# Patient Record
Sex: Male | Born: 1955 | Race: White | Hispanic: No | Marital: Single | Smoking: Never smoker
Health system: Southern US, Community
[De-identification: ages and names within clinical notes are randomized; demographics above are authoritative.]

## PROBLEM LIST (undated history)

## (undated) DIAGNOSIS — E119 Type 2 diabetes mellitus without complications: Secondary | ICD-10-CM

## (undated) DIAGNOSIS — E785 Hyperlipidemia, unspecified: Secondary | ICD-10-CM

---

## 2004-11-20 ENCOUNTER — Ambulatory Visit: Payer: Self-pay | Admitting: Internal Medicine

## 2009-02-21 ENCOUNTER — Ambulatory Visit: Payer: Self-pay | Admitting: Gastroenterology

## 2014-03-02 ENCOUNTER — Ambulatory Visit: Payer: Self-pay | Admitting: Internal Medicine

## 2014-04-19 DIAGNOSIS — M5116 Intervertebral disc disorders with radiculopathy, lumbar region: Secondary | ICD-10-CM | POA: Insufficient documentation

## 2016-06-27 DIAGNOSIS — E119 Type 2 diabetes mellitus without complications: Secondary | ICD-10-CM | POA: Insufficient documentation

## 2017-07-13 DIAGNOSIS — E039 Hypothyroidism, unspecified: Secondary | ICD-10-CM | POA: Insufficient documentation

## 2018-01-20 HISTORY — PX: OTHER SURGICAL HISTORY: SHX169

## 2018-08-05 ENCOUNTER — Other Ambulatory Visit: Payer: Self-pay

## 2018-08-05 ENCOUNTER — Ambulatory Visit: Payer: Self-pay | Admitting: Surgery

## 2018-08-05 ENCOUNTER — Other Ambulatory Visit
Admission: RE | Admit: 2018-08-05 | Discharge: 2018-08-05 | Disposition: A | Discharge status: Home / Self Care | Payer: PRIVATE HEALTH INSURANCE | Source: Ambulatory Visit | Attending: Surgery | Admitting: Surgery

## 2018-08-05 DIAGNOSIS — Z1159 Encounter for screening for other viral diseases: Secondary | ICD-10-CM | POA: Diagnosis not present

## 2018-08-05 NOTE — H&P (View-Only) (Signed)
Subjective:   CC: Enlarged lymph node [R59.9]  HPI:  Tyler Cox is a 63 y.o. male who was referred by Yevonne Pax, MD for evaluation of above. First noted several weeks ago.  Non-tender, not changing in size.  Trial of abx did not make any difference.  Complains of chronic throat irritation with episodic sensation of food getting stuck in throat, but no emesis.  Mostly non-productive cough.  Not associated with specific activity, time of day, or temperature.  Has not tried OTC remedies   Past Medical History:  has a past medical history of Diabetes mellitus type 2, uncomplicated (CMS-HCC) and Other and unspecified hyperlipidemia.  Past Surgical History: none reported  Family History: family history includes Atrial fibrillation (Abnormal heart rhythm sometimes requiring treatment with blood thinners) in his mother; Colon cancer in an other family member; Coronary Artery Disease (Blocked arteries around heart) in an other family member; Diabetes in his mother; High blood pressure (Hypertension) in his father; Prostate cancer in his father.  Social History:  reports that he has never smoked. He has never used smokeless tobacco. He reports that he does not drink alcohol or use drugs.  Current Medications: has a current medication list which includes the following prescription(s): lisinopril and metformin.  Allergies:  No Known Allergies  ROS:  A 15 point review of systems was performed and pertinent positives and negatives noted in HPI   Objective:   BP 153/81   Pulse 96   Ht 167.6 cm (5\' 6" )   Wt 72.1 kg (159 lb)   BMI 25.66 kg/m   Constitutional :  alert, appears stated age, cooperative and no distress  Lymphatics/Throat:  no asymmetry, masses, or scars  Respiratory:  clear to auscultation bilaterally  Cardiovascular:  regular rate and rhythm  Gastrointestinal: soft, non-tender; bowel sounds normal; no masses,  no organomegaly.    Musculoskeletal:  Steady gait and movement  Skin: Cool and moist.  Palpable right supraclavicular node, towards base of neck, anterior to trapezius.  Size roughly 2cm x 2cm.  Smooth, mobile, no overlying skin change.  No other skin lesions, adenopathy noted on contralateral aspect, distal right arm, head and scalp area.  Psychiatric: Normal affect, non-agitated, not confused       LABS:  n/a   RADS: External CXR negative for specific pathology  Assessment:      Enlarged lymph node [R59.9], RIGHT SUPRACLAVICULAR  Plan:   1. Enlarged lymph node [R59.9] Discussed surgical excision.  Alternatives include continued observation.  Benefits include possible symptom relief, pathologic evaluation. Discussed the risk of surgery including recurrence, injury to surrounding structures, chronic pain, post-op infxn, poor cosmesis, poor/delayed wound healing, and possible re-operation to address said risks. The risks of general anesthetic, if used, includes MI, CVA, sudden death or even reaction to anesthetic medications also discussed.  Typical post-op recovery time of 3-5 days with possible activity restrictions were also discussed.  The patient verbalized understanding and all questions were answered to the patient's satisfaction.  2. Patient has elected to proceed with surgical excision for pathology evaluation. Procedure will be scheduled.  Written consent was obtained.

## 2018-08-05 NOTE — H&P (Signed)
Subjective:   CC: Enlarged lymph node [R59.9]  HPI:  Tyler Cox is a 62 y.o. male who was referred by Mark Frederic Miller, MD for evaluation of above. First noted several weeks ago.  Non-tender, not changing in size.  Trial of abx did not make any difference.  Complains of chronic throat irritation with episodic sensation of food getting stuck in throat, but no emesis.  Mostly non-productive cough.  Not associated with specific activity, time of day, or temperature.  Has not tried OTC remedies   Past Medical History:  has a past medical history of Diabetes mellitus type 2, uncomplicated (CMS-HCC) and Other and unspecified hyperlipidemia.  Past Surgical History: none reported  Family History: family history includes Atrial fibrillation (Abnormal heart rhythm sometimes requiring treatment with blood thinners) in his mother; Colon cancer in an other family member; Coronary Artery Disease (Blocked arteries around heart) in an other family member; Diabetes in his mother; High blood pressure (Hypertension) in his father; Prostate cancer in his father.  Social History:  reports that he has never smoked. He has never used smokeless tobacco. He reports that he does not drink alcohol or use drugs.  Current Medications: has a current medication list which includes the following prescription(s): lisinopril and metformin.  Allergies:  No Known Allergies  ROS:  A 15 point review of systems was performed and pertinent positives and negatives noted in HPI   Objective:   BP 153/81   Pulse 96   Ht 167.6 cm (5' 6")   Wt 72.1 kg (159 lb)   BMI 25.66 kg/m   Constitutional :  alert, appears stated age, cooperative and no distress  Lymphatics/Throat:  no asymmetry, masses, or scars  Respiratory:  clear to auscultation bilaterally  Cardiovascular:  regular rate and rhythm  Gastrointestinal: soft, non-tender; bowel sounds normal; no masses,  no organomegaly.    Musculoskeletal:  Steady gait and movement  Skin: Cool and moist.  Palpable right supraclavicular node, towards base of neck, anterior to trapezius.  Size roughly 2cm x 2cm.  Smooth, mobile, no overlying skin change.  No other skin lesions, adenopathy noted on contralateral aspect, distal right arm, head and scalp area.  Psychiatric: Normal affect, non-agitated, not confused       LABS:  n/a   RADS: External CXR negative for specific pathology  Assessment:      Enlarged lymph node [R59.9], RIGHT SUPRACLAVICULAR  Plan:   1. Enlarged lymph node [R59.9] Discussed surgical excision.  Alternatives include continued observation.  Benefits include possible symptom relief, pathologic evaluation. Discussed the risk of surgery including recurrence, injury to surrounding structures, chronic pain, post-op infxn, poor cosmesis, poor/delayed wound healing, and possible re-operation to address said risks. The risks of general anesthetic, if used, includes MI, CVA, sudden death or even reaction to anesthetic medications also discussed.  Typical post-op recovery time of 3-5 days with possible activity restrictions were also discussed.  The patient verbalized understanding and all questions were answered to the patient's satisfaction.  2. Patient has elected to proceed with surgical excision for pathology evaluation. Procedure will be scheduled.  Written consent was obtained.  

## 2018-08-06 LAB — SARS CORONAVIRUS 2 (TAT 6-24 HRS): SARS Coronavirus 2: NEGATIVE

## 2018-08-06 NOTE — Patient Instructions (Signed)
Your procedure is scheduled [id number] 7/20 Report to Day Surgery. At 9:30  Remember: Instructions that are not followed completely may result in serious medical risk,  up to and including death, or upon the discretion of your surgeon and anesthesiologist your  surgery may need to be rescheduled.     _X__ 1. Do not eat food after midnight the night before your procedure.                 No gum chewing or hard candies. You may drink clear liquids up to 2 hours                 before you are scheduled to arrive for your surgery- DO not drink clear                 liquids within 2 hours of the start of your surgery.                 Clear Liquids include:  water, apple juice without pulp, clear carbohydrate                 drink such as Clearfast of Gatorade, Black Coffee or Tea (Do not add                 anything to coffee or tea).  __X__2.  On the morning of surgery brush your teeth with toothpaste and water, you                may rinse your mouth with mouthwash if you wish.  Do not swallow any toothpaste of mouthwash.     _X__ 3.  No Alcohol for 24 hours before or after surgery.   ___ 4.  Do Not Smoke or use e-cigarettes For 24 Hours Prior to Your Surgery.                 Do not use any chewable tobacco products for at least 6 hours prior to                 surgery.  ____  5.  Bring all medications with you on the day of surgery if instructed.   __x__  6.  Notify your doctor if there is any change in your medical condition      (cold, fever, infections).     Do not wear jewelry, make-up, hairpins, clips or nail polish. Do not wear lotions, powders, or perfumes. You may wear deodorant. Do not shave 48 hours prior to surgery. Men may shave face and neck. Do not bring valuables to the hospital.    [facility] is not responsible for any belongings or valuables.  Contacts, dentures or bridgework may not be worn into surgery. Leave your suitcase in the car. After  surgery it may be brought to your room. For patients admitted to the hospital, discharge time is determined by your treatment team.   Patients discharged the day of surgery will not be allowed to drive home.   Please read over the following fact sheets that you were given:     __x__ Take these medicines the morning of surgery with A SIP OF WATER:    1. none  2.   3.   4.  5.  6.  ____ Fleet Enema (as directed)   _x___ Shower the night before and the morning of the surgery.  ___ Use inhalers on the day of surgery  _x___ Stop metformin 2 days prior to surgery  last dose today    ____ Take 1/2 of usual insulin dose the night before surgery. No insulin the morning          of surgery.   _x__ Stopped aspirin already    __x__ Stopped Anti-inflammatories already   ____ Stop supplements until after surgery.    ____ Bring C-Pap to the hospital.

## 2018-08-06 NOTE — Pre-Procedure Instructions (Signed)
Phone call with patient to discuss instructions on medication for surgery on Monday 7/20.  He is aware of his arrival time.  All questions answered.  Will stop Metformin today and has not taken ibuprofen or ASA in 1 week.

## 2018-08-08 MED ORDER — CEFAZOLIN SODIUM-DEXTROSE 2-4 GM/100ML-% IV SOLN
2.0000 g | INTRAVENOUS | Status: AC
  Administered 2018-08-09: 2 g via INTRAVENOUS

## 2018-08-09 ENCOUNTER — Other Ambulatory Visit: Payer: Self-pay

## 2018-08-09 ENCOUNTER — Ambulatory Visit
Admission: RE | Admit: 2018-08-09 | Discharge: 2018-08-09 | Disposition: A | Discharge status: Home / Self Care | Payer: PRIVATE HEALTH INSURANCE | Attending: Surgery | Admitting: Surgery

## 2018-08-09 ENCOUNTER — Ambulatory Visit: Payer: PRIVATE HEALTH INSURANCE | Admitting: Anesthesiology

## 2018-08-09 ENCOUNTER — Encounter
Admission: RE | Disposition: A | Discharge status: Home / Self Care | Payer: Self-pay | Source: Home / Self Care | Attending: Surgery

## 2018-08-09 ENCOUNTER — Encounter: Payer: Self-pay | Admitting: *Deleted

## 2018-08-09 DIAGNOSIS — Z7984 Long term (current) use of oral hypoglycemic drugs: Secondary | ICD-10-CM | POA: Diagnosis not present

## 2018-08-09 DIAGNOSIS — E119 Type 2 diabetes mellitus without complications: Secondary | ICD-10-CM | POA: Insufficient documentation

## 2018-08-09 DIAGNOSIS — R599 Enlarged lymph nodes, unspecified: Secondary | ICD-10-CM

## 2018-08-09 DIAGNOSIS — R590 Localized enlarged lymph nodes: Secondary | ICD-10-CM | POA: Insufficient documentation

## 2018-08-09 DIAGNOSIS — Z79899 Other long term (current) drug therapy: Secondary | ICD-10-CM | POA: Insufficient documentation

## 2018-08-09 HISTORY — PX: LYMPH NODE BIOPSY: SHX201

## 2018-08-09 HISTORY — DX: Type 2 diabetes mellitus without complications: E11.9

## 2018-08-09 HISTORY — DX: Hyperlipidemia, unspecified: E78.5

## 2018-08-09 LAB — GLUCOSE, CAPILLARY
Glucose-Capillary: 139 mg/dL — ABNORMAL HIGH (ref 70–99)
Glucose-Capillary: 161 mg/dL — ABNORMAL HIGH (ref 70–99)

## 2018-08-09 SURGERY — LYMPH NODE BIOPSY
Anesthesia: General | Site: Neck | Laterality: Right

## 2018-08-09 MED ORDER — DEXAMETHASONE SODIUM PHOSPHATE 10 MG/ML IJ SOLN
INTRAMUSCULAR | Status: DC | PRN
  Administered 2018-08-09: 5 mg via INTRAVENOUS

## 2018-08-09 MED ORDER — ACETAMINOPHEN 325 MG PO TABS: 650.0000 mg | ORAL_TABLET | Freq: Three times a day (TID) | ORAL | 0 refills | Status: AC | PRN

## 2018-08-09 MED ORDER — DEXAMETHASONE SODIUM PHOSPHATE 10 MG/ML IJ SOLN
INTRAMUSCULAR | Status: AC
  Filled 2018-08-09: qty 1

## 2018-08-09 MED ORDER — PROPOFOL 10 MG/ML IV BOLUS
INTRAVENOUS | Status: AC
  Filled 2018-08-09: qty 20

## 2018-08-09 MED ORDER — SUCCINYLCHOLINE CHLORIDE 20 MG/ML IJ SOLN
INTRAMUSCULAR | Status: DC | PRN
  Administered 2018-08-09: 100 mg via INTRAVENOUS

## 2018-08-09 MED ORDER — FENTANYL CITRATE (PF) 100 MCG/2ML IJ SOLN
INTRAMUSCULAR | Status: DC | PRN
  Administered 2018-08-09: 100 ug via INTRAVENOUS

## 2018-08-09 MED ORDER — DOCUSATE SODIUM 100 MG PO CAPS: 100.0000 mg | ORAL_CAPSULE | Freq: Two times a day (BID) | ORAL | 0 refills | Status: AC | PRN

## 2018-08-09 MED ORDER — FAMOTIDINE 20 MG PO TABS
20.0000 mg | ORAL_TABLET | Freq: Once | ORAL | Status: AC
  Administered 2018-08-09: 20 mg via ORAL

## 2018-08-09 MED ORDER — ONDANSETRON HCL 4 MG/2ML IJ SOLN
INTRAMUSCULAR | Status: AC
  Filled 2018-08-09: qty 2

## 2018-08-09 MED ORDER — HYDROCODONE-ACETAMINOPHEN 5-325 MG PO TABS: 1.0000 | ORAL_TABLET | Freq: Four times a day (QID) | ORAL | 0 refills | Status: AC | PRN

## 2018-08-09 MED ORDER — SODIUM CHLORIDE 0.9 % IV SOLN
INTRAVENOUS | Status: DC
  Administered 2018-08-09: 11:00:00 via INTRAVENOUS

## 2018-08-09 MED ORDER — FENTANYL CITRATE (PF) 100 MCG/2ML IJ SOLN: 25.0000 ug | INTRAMUSCULAR | Status: DC | PRN

## 2018-08-09 MED ORDER — BUPIVACAINE-EPINEPHRINE (PF) 0.5% -1:200000 IJ SOLN
INTRAMUSCULAR | Status: AC
  Filled 2018-08-09: qty 30

## 2018-08-09 MED ORDER — CHLORHEXIDINE GLUCONATE CLOTH 2 % EX PADS: 6.0000 | MEDICATED_PAD | Freq: Once | CUTANEOUS | Status: DC

## 2018-08-09 MED ORDER — BUPIVACAINE-EPINEPHRINE 0.5% -1:200000 IJ SOLN
INTRAMUSCULAR | Status: DC | PRN
  Administered 2018-08-09: 1.5 mL

## 2018-08-09 MED ORDER — MIDAZOLAM HCL 2 MG/2ML IJ SOLN
INTRAMUSCULAR | Status: DC | PRN
  Administered 2018-08-09: 2 mg via INTRAVENOUS

## 2018-08-09 MED ORDER — OXYCODONE HCL 5 MG PO TABS: 5.0000 mg | ORAL_TABLET | Freq: Once | ORAL | Status: DC | PRN

## 2018-08-09 MED ORDER — IBUPROFEN 800 MG PO TABS: 800.0000 mg | ORAL_TABLET | Freq: Three times a day (TID) | ORAL | 0 refills | Status: AC | PRN

## 2018-08-09 MED ORDER — OXYCODONE HCL 5 MG/5ML PO SOLN: 5.0000 mg | Freq: Once | ORAL | Status: DC | PRN

## 2018-08-09 MED ORDER — ONDANSETRON HCL 4 MG/2ML IJ SOLN
INTRAMUSCULAR | Status: DC | PRN
  Administered 2018-08-09: 4 mg via INTRAVENOUS

## 2018-08-09 MED ORDER — FAMOTIDINE 20 MG PO TABS
ORAL_TABLET | ORAL | Status: AC
  Filled 2018-08-09: qty 1

## 2018-08-09 MED ORDER — ACETAMINOPHEN 10 MG/ML IV SOLN
INTRAVENOUS | Status: DC | PRN
  Administered 2018-08-09: 1000 mg via INTRAVENOUS

## 2018-08-09 MED ORDER — LIDOCAINE HCL 1 % IJ SOLN
INTRAMUSCULAR | Status: DC | PRN
  Administered 2018-08-09: 1.5 mL

## 2018-08-09 MED ORDER — CEFAZOLIN SODIUM-DEXTROSE 2-4 GM/100ML-% IV SOLN
INTRAVENOUS | Status: AC
  Filled 2018-08-09: qty 100

## 2018-08-09 MED ORDER — LIDOCAINE HCL (CARDIAC) PF 100 MG/5ML IV SOSY
PREFILLED_SYRINGE | INTRAVENOUS | Status: DC | PRN
  Administered 2018-08-09: 60 mg via INTRAVENOUS

## 2018-08-09 MED ORDER — ACETAMINOPHEN 10 MG/ML IV SOLN
INTRAVENOUS | Status: AC
  Filled 2018-08-09: qty 100

## 2018-08-09 MED ORDER — PROPOFOL 10 MG/ML IV BOLUS
INTRAVENOUS | Status: DC | PRN
  Administered 2018-08-09: 130 mg via INTRAVENOUS

## 2018-08-09 MED ORDER — MIDAZOLAM HCL 2 MG/2ML IJ SOLN
INTRAMUSCULAR | Status: AC
  Filled 2018-08-09: qty 2

## 2018-08-09 MED ORDER — FENTANYL CITRATE (PF) 100 MCG/2ML IJ SOLN
INTRAMUSCULAR | Status: AC
  Filled 2018-08-09: qty 2

## 2018-08-09 MED ORDER — LIDOCAINE HCL (PF) 2 % IJ SOLN
INTRAMUSCULAR | Status: AC
  Filled 2018-08-09: qty 10

## 2018-08-09 MED ORDER — LIDOCAINE HCL (PF) 1 % IJ SOLN
INTRAMUSCULAR | Status: AC
  Filled 2018-08-09: qty 30

## 2018-08-09 MED ORDER — SUCCINYLCHOLINE CHLORIDE 20 MG/ML IJ SOLN
INTRAMUSCULAR | Status: AC
  Filled 2018-08-09: qty 1

## 2018-08-09 SURGICAL SUPPLY — 31 items
BLADE SURG 15 STRL LF DISP TIS (BLADE) ×1 IMPLANT
BLADE SURG 15 STRL SS (BLADE) ×2
CHLORAPREP W/TINT 26 (MISCELLANEOUS) ×3 IMPLANT
COVER WAND RF STERILE (DRAPES) ×3 IMPLANT
DERMABOND ADVANCED (GAUZE/BANDAGES/DRESSINGS) ×2
DERMABOND ADVANCED .7 DNX12 (GAUZE/BANDAGES/DRESSINGS) ×1 IMPLANT
DRAPE 3/4 80X56 (DRAPES) ×3 IMPLANT
DRAPE LAPAROTOMY 100X77 ABD (DRAPES) ×3 IMPLANT
ELECT CAUTERY BLADE 6.4 (BLADE) ×3 IMPLANT
ELECT REM PT RETURN 9FT ADLT (ELECTROSURGICAL) ×3
ELECTRODE REM PT RTRN 9FT ADLT (ELECTROSURGICAL) ×1 IMPLANT
GLOVE BIOGEL PI IND STRL 7.0 (GLOVE) ×1 IMPLANT
GLOVE BIOGEL PI INDICATOR 7.0 (GLOVE) ×2
GLOVE SURG SYN 6.5 ES PF (GLOVE) ×3 IMPLANT
GLOVE SURG SYN 6.5 PF PI (GLOVE) ×1 IMPLANT
GOWN STRL REUS W/ TWL LRG LVL3 (GOWN DISPOSABLE) ×2 IMPLANT
GOWN STRL REUS W/TWL LRG LVL3 (GOWN DISPOSABLE) ×4
KIT TURNOVER KIT A (KITS) ×3 IMPLANT
LABEL OR SOLS (LABEL) ×3 IMPLANT
NEEDLE HYPO 22GX1.5 SAFETY (NEEDLE) ×3 IMPLANT
NS IRRIG 1000ML POUR BTL (IV SOLUTION) ×3 IMPLANT
PACK BASIN MINOR ARMC (MISCELLANEOUS) ×3 IMPLANT
SUT ETHILON 3-0 FS-10 30 BLK (SUTURE)
SUT MNCRL 4-0 (SUTURE) ×2
SUT MNCRL 4-0 27XMFL (SUTURE) ×1
SUT VIC AB 3-0 SH 27 (SUTURE) ×2
SUT VIC AB 3-0 SH 27X BRD (SUTURE) ×1 IMPLANT
SUTURE EHLN 3-0 FS-10 30 BLK (SUTURE) IMPLANT
SUTURE MNCRL 4-0 27XMF (SUTURE) ×1 IMPLANT
SYR 30ML LL (SYRINGE) ×3 IMPLANT
TOWEL OR 17X26 4PK STRL BLUE (TOWEL DISPOSABLE) ×3 IMPLANT

## 2018-08-09 NOTE — Discharge Instructions (Addendum)
Biopsy, Care After °This sheet gives you information about how to care for yourself after your procedure. Your health care provider may also give you more specific instructions. If you have problems or questions, contact your health care provider. °What can I expect after the procedure? °After the procedure, it is common to have: °· Soreness. °· Bruising. °· Itching. °Follow these instructions at home: °Biopsy site care °Follow instructions from your health care provider about how to take care of your biopsy site. Make sure you: °· Wash your hands with soap and water before and after you change your bandage (dressing). If soap and water are not available, use hand sanitizer. °· Leave stitches (sutures), skin glue, or adhesive strips in place. These skin closures may need to stay in place for 2 weeks or longer. If adhesive strip edges start to loosen and curl up, you may trim the loose edges. Do not remove adhesive strips completely unless your health care provider tells you to do that. °· If the biopsy area bleeds or bruises, apply gentle pressure for 10 minutes. °· OK TO SHOWER IN 24HRS °Check your biopsy site every day for signs of infection. Check for: °· Redness, swelling, or pain. °· Fluid or blood. °· Warmth. °· Pus or a bad smell. ° °General instructions °· Rest and then return to your normal activities as told by your health care provider. °•  tylenol and advil as needed for discomfort.  Please alternate between the two every four hours as needed for pain.   °•  Use narcotics, if prescribed, only when tylenol and motrin is not enough to control pain. °•  325-650mg every 8hrs to max of 3000mg/24hrs (including the 325mg in every norco dose) for the tylenol.   °•  Advil up to 800mg per dose every 8hrs as needed for pain.   °· Keep all follow-up visits as told by your health care provider. This is important. °Contact a health care provider if: °· You have redness, swelling, or pain around your biopsy  site. °· You have fluid or blood coming from your biopsy site. °· Your biopsy site feels warm to the touch. °· You have pus or a bad smell coming from your biopsy site. °· You have a fever. °· Your sutures, skin glue, or adhesive strips loosen or come off sooner than expected. °Get help right away if: °· You have bleeding that does not stop with pressure or a dressing. °Summary °· After the procedure, it is common to have some soreness, bruising, and itching at the site. °· Follow instructions from your health care provider about how to take care of your biopsy site. °· Check your biopsy site every day for signs of infection. °· Contact a health care provider if you have redness, swelling, or pain around your biopsy site, or your biopsy site feels warm to the touch. °· Keep all follow-up visits as told by your health care provider. This is important. °This information is not intended to replace advice given to you by your health care provider. Make sure you discuss any questions you have with your health care provider. °Document Released: 02/02/2015 Document Revised: 07/06/2017 Document Reviewed: 07/06/2017 °Elsevier Interactive Patient Education © 2019 [employer] ° °AMBULATORY SURGERY  °DISCHARGE INSTRUCTIONS ° ° °1) The drugs that you were given will stay in your system until tomorrow so for the next 24 hours you should not: ° °A) Drive an automobile °B) Make any legal decisions °C) Drink any alcoholic beverage ° ° °  2) You may resume regular meals tomorrow.  Today it is better to start with liquids and gradually work up to solid foods. ° °You may eat anything you prefer, but it is better to start with liquids, then soup and crackers, and gradually work up to solid foods. ° ° °3) Please notify your doctor immediately if you have any unusual bleeding, trouble breathing, redness and pain at the surgery site, drainage, fever, or pain not relieved by medication. ° ° ° °4) Additional  Instructions: ° ° ° ° ° ° ° °Please contact your physician with any problems or Same Day Surgery at [phone], Monday through Friday 6 am to 4 pm, or [place] [facility] number at [phone]. ° ° °

## 2018-08-09 NOTE — Transfer of Care (Signed)
Immediate Anesthesia Transfer of Care Note  Patient: Tyler Cox  Procedure(s) Performed: LYMPH NODE EXCISION OF SUPRACLAVICULAR, RIGHT (Right Neck)  Patient Location: PACU  Anesthesia Type:General  Level of Consciousness: drowsy and patient cooperative  Airway & Oxygen Therapy: Patient Spontanous Breathing and Patient connected to face mask oxygen  Post-op Assessment: Report given to RN and Post -op Vital signs reviewed and stable  Post vital signs: Reviewed and stable  Last Vitals:  Vitals Value Taken Time  BP 139/81 08/09/18 1151  Temp 36.4 C 08/09/18 1150  Pulse 66 08/09/18 1153  Resp 12 08/09/18 1153  SpO2 100 % 08/09/18 1153  Vitals shown include unvalidated device data.  Last Pain:  Vitals:   08/09/18 1150  TempSrc:   PainSc: 0-No pain         Complications: No apparent anesthesia complications

## 2018-08-09 NOTE — Anesthesia Procedure Notes (Signed)
Procedure Name: Intubation Date/Time: 08/09/2018 11:06 AM Performed by: Jonna Clark, CRNA Pre-anesthesia Checklist: Patient identified, Patient being monitored, Timeout performed, Emergency Drugs available and Suction available Patient Re-evaluated:Patient Re-evaluated prior to induction Oxygen Delivery Method: Circle system utilized Preoxygenation: Pre-oxygenation with 100% oxygen Induction Type: IV induction and Cricoid Pressure applied Ventilation: Mask ventilation without difficulty Laryngoscope Size: Mac and 3 Grade View: Grade III Tube type: Oral Tube size: 7.5 mm Number of attempts: 1 Placement Confirmation: ETT inserted through vocal cords under direct vision,  positive ETCO2 and breath sounds checked- equal and bilateral Secured at: 21 cm Tube secured with: Tape Dental Injury: Teeth and Oropharynx as per pre-operative assessment  Difficulty Due To: Difficult Airway- due to anterior larynx and Difficulty was unanticipated Future Recommendations: Recommend- induction with short-acting agent, and alternative techniques readily available

## 2018-08-09 NOTE — Anesthesia Preprocedure Evaluation (Signed)
Anesthesia Evaluation  Patient identified by MRN, date of birth, ID band Patient awake    Reviewed: Allergy & Precautions, H&P , NPO status , Patient's Chart, lab work & pertinent test results  History of Anesthesia Complications Negative for: history of anesthetic complications  Airway Mallampati: III  TM Distance: <3 FB Neck ROM: limited    Dental  (+) Chipped, Poor Dentition   Pulmonary neg shortness of breath, Recent URI , Residual Cough,           Cardiovascular Exercise Tolerance: Good      Neuro/Psych negative neurological ROS  negative psych ROS   GI/Hepatic negative GI ROS, Neg liver ROS, neg GERD  ,  Endo/Other  diabetes, Type 2  Renal/GU      Musculoskeletal   Abdominal   Peds  Hematology negative hematology ROS (+)   Anesthesia Other Findings Past Medical History: No date: Diabetes mellitus without complication (HCC) No date: Hyperlipidemia  Past Surgical History: No date: NO PAST SURGERIES  BMI    Body Mass Index: 25.66 kg/m      Reproductive/Obstetrics negative OB ROS                             Anesthesia Physical Anesthesia Plan  ASA: III  Anesthesia Plan: General ETT   Post-op Pain Management:    Induction: Intravenous  PONV Risk Score and Plan: Ondansetron, Dexamethasone, Midazolam and Treatment may vary due to age or medical condition  Airway Management Planned: Oral ETT  Additional Equipment:   Intra-op Plan:   Post-operative Plan: Extubation in OR  Informed Consent: I have reviewed the patients History and Physical, chart, labs and discussed the procedure including the risks, benefits and alternatives for the proposed anesthesia with the patient or authorized representative who has indicated his/her understanding and acceptance.     Dental Advisory Given  Plan Discussed with: Anesthesiologist, CRNA and Surgeon  Anesthesia Plan Comments:  (Patient consented for risks of anesthesia including but not limited to:  - adverse reactions to medications - damage to teeth, lips or other oral mucosa - sore throat or hoarseness - Damage to heart, brain, lungs or loss of life  Patient voiced understanding.)        Anesthesia Quick Evaluation

## 2018-08-09 NOTE — Op Note (Signed)
Pre-Op Dx: right cervical lymph node enlargement Post-Op Dx: Large Right deep cervical lymph node x1 Anesthesia: GETA EBL: minimal Complications:  none apparent Specimen: Right deep cervical lymph node Procedure: excisional biopsy of Right deep cervical lymph node  Surgeon: Lysle Pearl  Indication for procedure: Palpable right cervical lymph node.  Please see H&P for further detail   Description of Procedure:  Consent obtained, time out performed.  Patient placed in supine position.  GETA induced by anesthesia.  Preoperative antibiotics given.  Area sterilized and draped in usual position.  Timeout performed local infused to area previously marked.  4 cm incision made through dermis with 15blade and SCM retracted to the posterior aspect to reveal a 2 cm x 2 cm enlarged lymph node superficial to the internal jugular vein..  This was removed from surrounding tissue completely using electrocautery and blunt dissection, passed off field pending pathology.  Wound hemostasis noted, no injury to surrounding structures.wound then irrigated, then closed in two layer fashion with 4-0 vicryl in interrupted fashion for deep dermal layer, then running 4-0 monocryl in subcuticular fashion for epidermal layer.  Wound then dressed with dermabond.  Pt tolerated procedure well, and transferred to PACU in stable condition. Sponge and instrument count correct at end of procedure.

## 2018-08-09 NOTE — Anesthesia Postprocedure Evaluation (Signed)
Anesthesia Post Note  Patient: Tyler Cox  Procedure(s) Performed: LYMPH NODE EXCISION OF SUPRACLAVICULAR, RIGHT (Right Neck)  Patient location during evaluation: PACU Anesthesia Type: General Level of consciousness: awake and alert Pain management: pain level controlled Vital Signs Assessment: post-procedure vital signs reviewed and stable Respiratory status: spontaneous breathing, nonlabored ventilation, respiratory function stable and patient connected to nasal cannula oxygen Cardiovascular status: blood pressure returned to baseline and stable Postop Assessment: no apparent nausea or vomiting Anesthetic complications: no     Last Vitals:  Vitals:   08/09/18 1205 08/09/18 1220  BP: (!) 154/80 136/87  Pulse: 71 77  Resp: 11 14  Temp:  36.7 C  SpO2: 100% 100%    Last Pain:  Vitals:   08/09/18 1220  TempSrc:   PainSc: 0-No pain                 Precious Haws Piscitello

## 2018-08-09 NOTE — Interval H&P Note (Signed)
History and Physical Interval Note:  08/09/2018 10:58 AM  Tyler Cox  has presented today for surgery, with the diagnosis of R59.9 ENLARGED LYMPH NODE.  The various methods of treatment have been discussed with the patient and family. After consideration of risks, benefits and other options for treatment, the patient has consented to  Procedure(s): LYMPH NODE EXCISION OF SUPRACLAVICULAR, RIGHT (Right) as a surgical intervention.  The patient's history has been reviewed, patient examined, no change in status, stable for surgery.  I have reviewed the patient's chart and labs.  Questions were answered to the patient's satisfaction.     [name]

## 2018-08-09 NOTE — Anesthesia Post-op Follow-up Note (Signed)
Anesthesia QCDR form completed.        

## 2018-08-12 ENCOUNTER — Encounter: Payer: Self-pay | Admitting: Cytopathology

## 2018-08-12 LAB — SURGICAL PATHOLOGY

## 2018-08-18 DIAGNOSIS — K219 Gastro-esophageal reflux disease without esophagitis: Secondary | ICD-10-CM | POA: Insufficient documentation

## 2018-09-21 ENCOUNTER — Other Ambulatory Visit: Payer: Self-pay | Admitting: Internal Medicine

## 2018-09-21 ENCOUNTER — Other Ambulatory Visit (HOSPITAL_COMMUNITY): Payer: Self-pay | Admitting: Internal Medicine

## 2018-09-21 DIAGNOSIS — R197 Diarrhea, unspecified: Secondary | ICD-10-CM

## 2018-09-21 DIAGNOSIS — R310 Gross hematuria: Secondary | ICD-10-CM

## 2018-09-24 ENCOUNTER — Other Ambulatory Visit
Admission: RE | Admit: 2018-09-24 | Discharge: 2018-09-24 | Disposition: A | Discharge status: Home / Self Care | Payer: PRIVATE HEALTH INSURANCE | Attending: Internal Medicine | Admitting: Internal Medicine

## 2018-09-24 ENCOUNTER — Ambulatory Visit
Admission: RE | Admit: 2018-09-24 | Discharge: 2018-09-24 | Disposition: A | Discharge status: Home / Self Care | Payer: PRIVATE HEALTH INSURANCE | Source: Ambulatory Visit | Attending: Internal Medicine | Admitting: Internal Medicine

## 2018-09-24 ENCOUNTER — Encounter (INDEPENDENT_AMBULATORY_CARE_PROVIDER_SITE_OTHER): Payer: Self-pay

## 2018-09-24 ENCOUNTER — Other Ambulatory Visit: Payer: Self-pay

## 2018-09-24 DIAGNOSIS — R310 Gross hematuria: Secondary | ICD-10-CM

## 2018-09-24 DIAGNOSIS — R197 Diarrhea, unspecified: Secondary | ICD-10-CM

## 2018-09-28 ENCOUNTER — Other Ambulatory Visit: Payer: Self-pay | Admitting: Internal Medicine

## 2018-09-28 DIAGNOSIS — R197 Diarrhea, unspecified: Secondary | ICD-10-CM

## 2018-09-28 DIAGNOSIS — R1084 Generalized abdominal pain: Secondary | ICD-10-CM

## 2018-09-28 DIAGNOSIS — R319 Hematuria, unspecified: Secondary | ICD-10-CM

## 2018-09-29 ENCOUNTER — Ambulatory Visit: Admission: RE | Admit: 2018-09-29 | Payer: PRIVATE HEALTH INSURANCE | Source: Ambulatory Visit

## 2018-10-01 ENCOUNTER — Other Ambulatory Visit: Payer: Self-pay

## 2018-10-01 ENCOUNTER — Ambulatory Visit
Admission: RE | Admit: 2018-10-01 | Discharge: 2018-10-01 | Disposition: A | Discharge status: Home / Self Care | Payer: PRIVATE HEALTH INSURANCE | Source: Ambulatory Visit | Attending: Internal Medicine | Admitting: Internal Medicine

## 2018-10-01 DIAGNOSIS — R197 Diarrhea, unspecified: Secondary | ICD-10-CM | POA: Diagnosis present

## 2018-10-01 DIAGNOSIS — R319 Hematuria, unspecified: Secondary | ICD-10-CM | POA: Diagnosis present

## 2018-10-01 DIAGNOSIS — R1084 Generalized abdominal pain: Secondary | ICD-10-CM | POA: Insufficient documentation

## 2018-11-22 ENCOUNTER — Encounter (INDEPENDENT_AMBULATORY_CARE_PROVIDER_SITE_OTHER): Payer: Self-pay | Admitting: Vascular Surgery

## 2018-11-23 ENCOUNTER — Encounter (INDEPENDENT_AMBULATORY_CARE_PROVIDER_SITE_OTHER): Payer: Self-pay | Admitting: Vascular Surgery

## 2018-11-23 ENCOUNTER — Ambulatory Visit (INDEPENDENT_AMBULATORY_CARE_PROVIDER_SITE_OTHER): Payer: PRIVATE HEALTH INSURANCE | Admitting: Vascular Surgery

## 2018-11-23 ENCOUNTER — Encounter (INDEPENDENT_AMBULATORY_CARE_PROVIDER_SITE_OTHER): Payer: Self-pay

## 2018-11-23 ENCOUNTER — Other Ambulatory Visit: Payer: Self-pay

## 2018-11-23 VITALS — BP 177/96 | HR 76 | Resp 16 | Wt 153.2 lb

## 2018-11-23 DIAGNOSIS — R1013 Epigastric pain: Secondary | ICD-10-CM | POA: Diagnosis not present

## 2018-11-23 DIAGNOSIS — G8929 Other chronic pain: Secondary | ICD-10-CM | POA: Insufficient documentation

## 2018-11-23 DIAGNOSIS — E119 Type 2 diabetes mellitus without complications: Secondary | ICD-10-CM

## 2018-11-23 NOTE — Progress Notes (Signed)
Patient ID: Tyler Cox, male   DOB: 02/11/1955, 63 y.o.   MRN: [id number]  Chief Complaint  Patient presents with  . New Patient (Initial Visit)    ref Sabra Heck presistent abdominal pain,mesenteric ischemia    HPI Tyler Cox is a 63 y.o. male.  I am asked to see the patient by Dr. Sabra Heck for evaluation of chronic mesenteric ischemia.  The patient has had unexplained abdominal pain now for many months.  He has had fairly extensive work-up that has been unrevealing thus far including abdominal ultrasound and CT scan.  He had what sounds like a fairly adverse reaction to 3 different proton pump inhibitors for his reflux disease.  His pain is mostly mid abdominal.  Sometimes large meals or certain foods seem to exacerbate the pain but it is a little difficult to discern.  He has not had a significant amount of weight loss although he did say he lost some weight early on in the process.  He has some chronic back pain that has been present and as bothersome as his abdominal pain as well.  No real food fear.  No fevers or chills.     Past Medical History:  Diagnosis Date  . Diabetes mellitus without complication ([place])   . Hyperlipidemia     Past Surgical History:  Procedure Laterality Date  . LYMPH NODE BIOPSY Right 08/09/2018   Procedure: LYMPH NODE EXCISION OF SUPRACLAVICULAR, RIGHT;  Surgeon: Benjamine Sprague, DO;  Location: [facility] ORS;  Service: General;  Laterality: Right;  . NO PAST SURGERIES       Family History  Problem Relation Age of Onset  . Arrhythmia Mother   . Hypertension Mother   . Heart disease Father   . Heart attack Father   . Hypertension Father   No bleeding or clotting disorders   Social History   Tobacco Use  . Smoking status: Never Smoker  . Smokeless tobacco: Never Used  Substance Use Topics  . Alcohol use: Never    Frequency: Never  . Drug use: Never    Allergies  Allergen Reactions  . Omeprazole Diarrhea    Current Outpatient  Medications  Medication Sig Dispense Refill  . ibuprofen (ADVIL) 800 MG tablet Take 1 tablet (800 mg total) by mouth every 8 (eight) hours as needed for mild pain or moderate pain. 30 tablet 0  . lisinopril (ZESTRIL) 10 MG tablet Take 10 mg by mouth daily.    . metFORMIN (GLUCOPHAGE) 500 MG tablet Take 500 mg by mouth daily.    . sucralfate (CARAFATE) 1 g tablet Take by mouth.     No current facility-administered medications for this visit.       REVIEW OF SYSTEMS (Negative unless checked)  Constitutional: [] Weight loss  [] Fever  [] Chills Cardiac: [] Chest pain   [] Chest pressure   [] Palpitations   [] Shortness of breath when laying flat   [] Shortness of breath at rest   [] Shortness of breath with exertion. Vascular:  [] Pain in legs with walking   [] Pain in legs at rest   [] Pain in legs when laying flat   [] Claudication   [] Pain in feet when walking  [] Pain in feet at rest  [] Pain in feet when laying flat   [] History of DVT   [] Phlebitis   [] Swelling in legs   [] Varicose veins   [] Non-healing ulcers Pulmonary:   [] Uses home oxygen   [] Productive cough   [] Hemoptysis   [] Wheeze  [] COPD   [] Asthma Neurologic:  []   Dizziness  [] Blackouts   [] Seizures   [] History of stroke   [] History of TIA  [] Aphasia   [] Temporary blindness   [] Dysphagia   [] Weakness or numbness in arms   [] Weakness or numbness in legs Musculoskeletal:  [] Arthritis   [] Joint swelling   [] Joint pain   [x] Low back pain Hematologic:  [] Easy bruising  [] Easy bleeding   [] Hypercoagulable state   [] Anemic  [] Hepatitis Gastrointestinal:  [] Blood in stool   [] Vomiting blood  [x] Gastroesophageal reflux/heartburn   [x] Abdominal pain Genitourinary:  [] Chronic kidney disease   [] Difficult urination  [] Frequent urination  [] Burning with urination   [] Hematuria Skin:  [] Rashes   [] Ulcers   [] Wounds Psychological:  [] History of anxiety   []  History of major depression.    Physical Exam BP (!) 177/96 (BP Location: Right Arm)   Pulse 76    Resp 16   Wt 153 lb 3.2 oz (69.5 kg)   BMI 24.73 kg/m  Gen:  WD/WN, NAD Head: [place]/AT, No temporalis wasting. Ear/Nose/Throat: Hearing grossly intact, nares w/o erythema or drainage, oropharynx w/o Erythema/Exudate Eyes: Conjunctiva clear, sclera non-icteric  Neck: trachea midline.  No JVD.  Pulmonary:  Good air movement, respirations not labored, no use of accessory muscles  Cardiac: RRR, no JVD Vascular:  Vessel Right Left  Radial Palpable Palpable                                   Gastrointestinal:. No masses, surgical incisions, or scars.  Mild tenderness to palpation in the epigastric region.  No rebound or guarding. Musculoskeletal: M/S 5/5 throughout.  Extremities without ischemic changes.  No deformity or atrophy. No significant LE edema. Neurologic: Sensation grossly intact in extremities.  Symmetrical.  Speech is fluent. Motor exam as listed above. Psychiatric: Judgment intact, Mood & affect appropriate for pt's clinical situation. Dermatologic: No rashes or ulcers noted.  No cellulitis or open wounds.    Radiology No results found.  Labs No results found for this or any previous visit (from the past 2160 hour(s)).  Assessment/Plan:  Controlled type 2 diabetes mellitus without complication, without long-term current use of insulin (HCC) blood glucose control important in reducing the progression of atherosclerotic disease. Also, involved in wound healing. On appropriate medications.   Abdominal pain, chronic, epigastric The patient has unexplained abdominal pain that is not clear in its etiology.  He has had a CT and an ultrasound which have not shown obvious pathology.  Chronic mesenteric ischemia is often a scenario where many other etiologies have been evaluated without finding a source.  His symptoms are not classic for chronic visceral ischemia, but I do think it is reasonable to perform a mesenteric duplex for further evaluation.  I discussed the  pathophysiology and the natural history of chronic mesenteric ischemia.  I have described why it causes symptoms and why can be difficult to diagnose.  The duplex to be done in the near future at his convenience and we will see the patient back following this to discuss the results and determine further treatment options      11/23/2018, 10:49 AM   This note was created with Dragon medical transcription system.  Any errors from dictation are unintentional.

## 2018-11-23 NOTE — Assessment & Plan Note (Signed)
The patient has unexplained abdominal pain that is not clear in its etiology.  He has had a CT and an ultrasound which have not shown obvious pathology.  Chronic mesenteric ischemia is often a scenario where many other etiologies have been evaluated without finding a source.  His symptoms are not classic for chronic visceral ischemia, but I do think it is reasonable to perform a mesenteric duplex for further evaluation.  I discussed the pathophysiology and the natural history of chronic mesenteric ischemia.  I have described why it causes symptoms and why can be difficult to diagnose.  The duplex to be done in the near future at his convenience and we will see the patient back following this to discuss the results and determine further treatment options

## 2018-11-23 NOTE — Patient Instructions (Signed)
Chronic Mesenteric Ischemia  Chronic mesenteric ischemia is poor blood flow (circulation) in the vessels that supply blood to the stomach, intestines, and liver (mesenteric organs). When the blood supply is severely restricted, these organs cannot work properly. This condition is also called mesenteric angina, or intestinal angina. This condition is a long-term (chronic) condition. It happens when an artery or vein that provides blood to the mesenteric organs gradually becomes blocked or narrows over time, restricting the blood supply to these organs. What are the causes? This condition is commonly caused by fatty deposits that build up in an artery (plaque), which can narrow the artery and restrict blood flow. Other causes include:  Weakened areas in blood vessel walls (aneurysms).  Conditions that cause twisting or inflammation of blood vessels, such as fibromuscular dysplasia or arteritis.  A disorder in which blood clots form in the veins (venous thrombosis).  Scarring and thickening (fibrosis) of blood vessels caused by radiation therapy.  A tear in the aorta, the body's main artery (aortic dissection).  Blood vessel problems after illegal drug use, such as use of cocaine.  Tumors in the nervous system (neurofibromatosis).  Certain autoimmune diseases, such as lupus. What increases the risk? The following factors may make you more likely to develop this condition:  Being male.  Being over age 50, especially if you have a history of heart problems.  Smoking.  Having congestive heart failure.  Having an irregular heartbeat (arrhythmia).  Having a history of heart attack or stroke.  Having diabetes.  Having high cholesterol.  Having high blood pressure (hypertension).  Being overweight or obese.  Having kidney disease (renal disease) that requires dialysis. What are the signs or symptoms? Symptoms of this condition include:  Pain or cramps in the abdomen that  develop 15-60 minutes after a meal. This pain may last for 1-3 hours. Some people may develop a fear of eating because of this symptom.  Weight loss.  Diarrhea.  Bloody stool.  Nausea.  Vomiting.  Bloating.  Abdominal pain after stress or with exercise. How is this diagnosed? This condition is diagnosed based on:  Your medical history.  A physical exam.  Tests, such as: ? Ultrasound. ? CT scan. ? Blood tests. ? Urine tests. ? An imaging test that involves injecting a dye into your arteries to show blood flow through blood vessels (angiogram). This can help to show if there are any blockages in the vessels that lead to the intestines. ? Passing a small probe through the mouth and into the stomach to measure the output of carbon dioxide (gastric tonometry). This can help to indicate whether there is decreased blood flow to the stomach and intestines. How is this treated? This condition may be treated with:  Dietary changes such as eating smaller, low-fat, meals more frequently.  Lifestyle changes to treat underlying conditions that contribute to the disease, such as high cholesterol and high blood pressure.  Medicines to reduce blood clotting and increase blood flow.  Surgery to remove the blockage, repair arteries or veins, and restore blood flow. This may involve: ? Angioplasty. This is surgery to widen the affected artery, reduce the blockage, and sometimes insert a small, mesh tube (stent). ? Bypass surgery. This may be done to go around (bypass) the blockage and reconnect healthy arteries or veins. ? Placing a stent in the affected area. This may be done to help keep blocked arteries open. Follow these instructions at home: Eating and drinking   Eat a heart-healthy diet. This   includes fresh fruits and vegetables, whole grains, and lean proteins like chicken, fish, and beans.  Avoid foods that contain a lot of: ? Salt (sodium). ? Sugar. ? Saturated fat (such as  red meat). ? Trans fat (such as in fried foods).  Stay hydrated. Drink enough fluid to keep your urine pale yellow. Lifestyle  Stay active and get regular exercise as told by your health care provider. Aim for 150 minutes of moderate activity or 75 minutes of vigorous activity a week. Ask your health care provider what activities and forms of exercise are safe for you.  Maintain a healthy weight.  Work with your health care provider to manage your cholesterol.  Manage any other health problems you have, such as high blood pressure, diabetes, or heart rhythm problems.  Do not use any products that contain nicotine or tobacco, such as cigarettes, e-cigarettes, and chewing tobacco. If you need help quitting, ask your health care provider. General instructions  Take over-the-counter and prescription medicines only as told by your health care provider.  Keep all follow-up visits as told by your health care provider. This is important.  You may need to take actions to prevent or treat constipation, such as: ? Drink enough fluid to keep your urine pale yellow. ? Take over-the-counter or prescription medicines. ? Eat foods that are high in fiber, such as beans, whole grains, and fresh fruits and vegetables. ? Limit foods that are high in fat and processed sugars, such as fried or sweet foods. Contact a health care provider if:  Your symptoms do not improve or they return after treatment.  You have a fever.  You are constipated. Get help right away if you:  Have severe abdominal pain.  Have severe chest pain.  Have shortness of breath.  Feel weak or dizzy.  Have fast or irregular heartbeats (palpitations).  Have numbness or weakness in your face, arm, or leg.  Are confused.  Have trouble speaking or people have trouble understanding what you are saying.  Have trouble urinating.  Have blood in your stool.  Have severe nausea, vomiting, or persistent diarrhea. These  symptoms may represent a serious problem that is an emergency. Do not wait to see if the symptoms will go away. Get medical help right away. Call your local emergency services (911 in the [place] Do not drive yourself to the hospital. Summary  Mesenteric ischemia is poor circulation in the vessels that supply blood to the the stomach, intestines, and liver (mesenteric organs).  This condition happens when an artery or vein that provides blood to the mesenteric organs gradually becomes blocked or narrow, restricting the blood supply to the organs.  This condition is commonly caused by fatty deposits that build up in an artery (plaque), which can narrow the artery and restrict blood flow.  You are more likely to develop this condition if you are over age 50 and have a history of heart problems, high blood pressure, diabetes, or high cholesterol.  This condition is usually treated with medicines, dietary and lifestyle changes, and surgery to remove the blockage, repair arteries or veins, and restore blood flow. This information is not intended to replace advice given to you by your health care provider. Make sure you discuss any questions you have with your health care provider. Document Released: 08/26/2010 Document Revised: 09/11/2017 Document Reviewed: 09/11/2017 Elsevier Patient Education  2020 [employer]  

## 2018-11-23 NOTE — Assessment & Plan Note (Signed)
blood glucose control important in reducing the progression of atherosclerotic disease. Also, involved in wound healing. On appropriate medications.  

## 2018-11-24 ENCOUNTER — Ambulatory Visit (INDEPENDENT_AMBULATORY_CARE_PROVIDER_SITE_OTHER): Payer: PRIVATE HEALTH INSURANCE | Admitting: Nurse Practitioner

## 2018-11-24 ENCOUNTER — Ambulatory Visit (INDEPENDENT_AMBULATORY_CARE_PROVIDER_SITE_OTHER): Payer: PRIVATE HEALTH INSURANCE

## 2018-11-24 ENCOUNTER — Encounter (INDEPENDENT_AMBULATORY_CARE_PROVIDER_SITE_OTHER): Payer: Self-pay | Admitting: Nurse Practitioner

## 2018-11-24 VITALS — BP 164/91 | HR 76 | Resp 16

## 2018-11-24 DIAGNOSIS — R1013 Epigastric pain: Secondary | ICD-10-CM

## 2018-11-24 DIAGNOSIS — E119 Type 2 diabetes mellitus without complications: Secondary | ICD-10-CM | POA: Diagnosis not present

## 2018-11-24 DIAGNOSIS — G8929 Other chronic pain: Secondary | ICD-10-CM | POA: Diagnosis not present

## 2018-11-24 DIAGNOSIS — M5116 Intervertebral disc disorders with radiculopathy, lumbar region: Secondary | ICD-10-CM

## 2018-11-24 NOTE — Progress Notes (Signed)
SUBJECTIVE:  Patient ID: Tyler Cox, male    DOB: March 28, 1955, 63 y.o.   MRN: [id number] Chief Complaint  Patient presents with  . Follow-up    ultrasound follow up    HPI  Tyler Cox is a 63 y.o. male that presents today for follow-up regarding his abdominal pain.  The pain is typically within his epigastric area and he describes it as a sore feeling.  He describes it as not a terrible pain more annoying and it tends to be worse with exertion or stretching activities.  He also states that sometimes his seatbelt can worsen the pain.  He says he notices that his stomach has been a little bit more active than normal.  He states that this pain happened after trying several different acid reflux medication which did not agree well with him.  Subsequently following each of these medications the patient stated that he had issues with diarrhea that subsided as soon as he stopped taking the medications.  He denies any variation of pain when he is eating.  He denies any recent weight loss.  He denies any nausea or vomiting.  He denies any food phobia.  The patient does also admit to being anxious at times and feels that that may play a component as well.  Today the patient underwent noninvasive studies which show normal celiac artery, SMA, IMA and splenic artery, as well as hepatic artery findings.  There is a normal caliber abdominal aorta with no atherosclerosis seen.  There is normal mesenteric flow seen throughout.  There is no evidence of chronic mesenteric ischemia.  Past Medical History:  Diagnosis Date  . Diabetes mellitus without complication (HCC)   . Hyperlipidemia     Past Surgical History:  Procedure Laterality Date  . LYMPH NODE BIOPSY Right 08/09/2018   Procedure: LYMPH NODE EXCISION OF SUPRACLAVICULAR, RIGHT;  Surgeon: Sung AmabileSakai, Isami, DO;  Location: [facility] ORS;  Service: General;  Laterality: Right;  . NO PAST SURGERIES      Social History   Socioeconomic History   . Marital status: Single    Spouse name: Not on file  . Number of children: Not on file  . Years of education: Not on file  . Highest education level: Not on file  Occupational History  . Not on file  Social Needs  . Financial resource strain: Not on file  . Food insecurity    Worry: Not on file    Inability: Not on file  . Transportation needs    Medical: Not on file    Non-medical: Not on file  Tobacco Use  . Smoking status: Never Smoker  . Smokeless tobacco: Never Used  Substance and Sexual Activity  . Alcohol use: Never    Frequency: Never  . Drug use: Never  . Sexual activity: Not on file  Lifestyle  . Physical activity    Days per week: Not on file    Minutes per session: Not on file  . Stress: Not on file  Relationships  . Social Musicianconnections    Talks on phone: Not on file    Gets together: Not on file    Attends religious service: Not on file    Active member of club or organization: Not on file    Attends meetings of clubs or organizations: Not on file    Relationship status: Not on file  . Intimate partner violence    Fear of current or ex partner: Not on file  Emotionally abused: Not on file    Physically abused: Not on file    Forced sexual activity: Not on file  Other Topics Concern  . Not on file  Social History Narrative  . Not on file    Family History  Problem Relation Age of Onset  . Arrhythmia Mother   . Hypertension Mother   . Heart disease Father   . Heart attack Father   . Hypertension Father     Allergies  Allergen Reactions  . Omeprazole Diarrhea     Review of Systems   Review of Systems: Negative Unless Checked Constitutional: [] Weight loss  [] Fever  [] Chills Cardiac: [] Chest pain   []  Atrial Fibrillation  [] Palpitations   [] Shortness of breath when laying flat   [] Shortness of breath with exertion. [] Shortness of breath at rest Vascular:  [] Pain in legs with walking   [] Pain in legs with standing [] Pain in legs when laying  flat   [] Claudication    [] Pain in feet when laying flat    [] History of DVT   [] Phlebitis   [] Swelling in legs   [] Varicose veins   [] Non-healing ulcers Pulmonary:   [] Uses home oxygen   [] Productive cough   [] Hemoptysis   [] Wheeze  [] COPD   [] Asthma Neurologic:  [] Dizziness   [] Seizures  [] Blackouts [] History of stroke   [] History of TIA  [] Aphasia   [] Temporary Blindness   [] Weakness or numbness in arm   [] Weakness or numbness in leg Musculoskeletal:   [] Joint swelling   [] Joint pain   [x] Low back pain  []  History of Knee Replacement [] Arthritis [] back Surgeries  []  Spinal Stenosis    Hematologic:  [] Easy bruising  [] Easy bleeding   [] Hypercoagulable state   [] Anemic Gastrointestinal:  [] Diarrhea   [] Vomiting  [x] Gastroesophageal reflux/heartburn   [] Difficulty swallowing. [x] Abdominal pain Genitourinary:  [] Chronic kidney disease   [] Difficult urination  [] Anuric   [] Blood in urine [] Frequent urination  [] Burning with urination   [] Hematuria Skin:  [] Rashes   [] Ulcers [] Wounds Psychological:  [x] History of anxiety   []  History of major depression  []  Memory Difficulties      OBJECTIVE:   Physical Exam  BP (!) 164/91 (BP Location: Right Arm)   Pulse 76   Resp 16   Gen: WD/WN, NAD Head: [place]/AT, No temporalis wasting.  Ear/Nose/Throat: Hearing grossly intact, nares w/o erythema or drainage Eyes: PER, EOMI, sclera nonicteric.  Neck: Supple, no masses.  No JVD.  Pulmonary:  Good air movement, no use of accessory muscles.  Cardiac: RRR Vascular:  Vessel Right Left  Radial Palpable Palpable   Gastrointestinal: soft, non-distended. No guarding/no peritoneal signs.  Musculoskeletal: M/S 5/5 throughout.  No deformity or atrophy.  Neurologic: Pain and light touch intact in extremities.  Symmetrical.  Speech is fluent. Motor exam as listed above. Psychiatric: Judgment intact, Mood & affect appropriate for pt's clinical situation. Dermatologic: No Venous rashes. No Ulcers Noted.  No changes  consistent with cellulitis. Lymph : No Cervical lymphadenopathy, no lichenification or skin changes of chronic lymphedema.       ASSESSMENT AND PLAN:  1. Abdominal pain, chronic, epigastric Today the patient underwent noninvasive studies which show normal celiac artery, SMA, IMA and splenic artery, as well as hepatic artery findings.  There is a normal caliber abdominal aorta with no atherosclerosis seen.  There is normal mesenteric flow seen throughout.  There is no evidence of chronic mesenteric ischemia.  I had a long discussion with the patient regarding chronic mesenteric ischemia and the  related pathophysiology and signs symptoms that are typically related with it.  Based on the noninvasive studies as well as his description of symptoms, ischemic causes are unlikely to be the cause of his pain.  The patient does note that he has significant anxiety about certain things at times, and there has been researching to the fact that there can be a connection between gut issues and anxiety.  At this time the patient will defer further discussion and work-up to his primary care physician.  The patient will follow-up with [place] on a as needed basis.  2. Controlled type 2 diabetes mellitus without complication, without long-term current use of insulin (HCC) Continue hypoglycemic medications as already ordered, these medications have been reviewed and there are no changes at this time.  Hgb A1C to be monitored as already arranged by primary service   3. Lumbar disc disease with radiculopathy Continue NSAID medications as already ordered, these medications have been reviewed and there are no changes at this time.  Continued activity and therapy was stressed.    Current Outpatient Medications on File Prior to Visit  Medication Sig Dispense Refill  . ibuprofen (ADVIL) 800 MG tablet Take 1 tablet (800 mg total) by mouth every 8 (eight) hours as needed for mild pain or moderate pain. 30 tablet 0  .  lisinopril (ZESTRIL) 10 MG tablet Take 10 mg by mouth daily.    . metFORMIN (GLUCOPHAGE) 500 MG tablet Take 500 mg by mouth daily.    . sucralfate (CARAFATE) 1 g tablet Take by mouth.     No current facility-administered medications on file prior to visit.     There are no Patient Instructions on file for this visit. Return if symptoms worsen or fail to improve.   Georgiana Spinner, NP  This note was completed with Office manager.  Any errors are purely unintentional.

## 2019-03-31 ENCOUNTER — Other Ambulatory Visit: Payer: Self-pay

## 2019-03-31 ENCOUNTER — Other Ambulatory Visit
Admission: RE | Admit: 2019-03-31 | Discharge: 2019-03-31 | Disposition: A | Discharge status: Home / Self Care | Payer: PRIVATE HEALTH INSURANCE | Source: Ambulatory Visit | Attending: Internal Medicine | Admitting: Internal Medicine

## 2019-03-31 DIAGNOSIS — Z01812 Encounter for preprocedural laboratory examination: Secondary | ICD-10-CM | POA: Insufficient documentation

## 2019-03-31 DIAGNOSIS — Z20822 Contact with and (suspected) exposure to covid-19: Secondary | ICD-10-CM | POA: Insufficient documentation

## 2019-03-31 LAB — SARS CORONAVIRUS 2 (TAT 6-24 HRS): SARS Coronavirus 2: NEGATIVE

## 2019-04-01 ENCOUNTER — Encounter: Payer: Self-pay | Admitting: Internal Medicine

## 2019-04-04 ENCOUNTER — Ambulatory Visit: Payer: PRIVATE HEALTH INSURANCE | Admitting: Anesthesiology

## 2019-04-04 ENCOUNTER — Encounter
Admission: RE | Disposition: A | Discharge status: Home / Self Care | Payer: Self-pay | Source: Home / Self Care | Attending: Internal Medicine

## 2019-04-04 ENCOUNTER — Ambulatory Visit
Admission: RE | Admit: 2019-04-04 | Discharge: 2019-04-04 | Disposition: A | Discharge status: Home / Self Care | Payer: PRIVATE HEALTH INSURANCE | Attending: Internal Medicine | Admitting: Internal Medicine

## 2019-04-04 ENCOUNTER — Other Ambulatory Visit: Payer: Self-pay

## 2019-04-04 ENCOUNTER — Encounter: Payer: Self-pay | Admitting: Internal Medicine

## 2019-04-04 DIAGNOSIS — K319 Disease of stomach and duodenum, unspecified: Secondary | ICD-10-CM | POA: Insufficient documentation

## 2019-04-04 DIAGNOSIS — G8929 Other chronic pain: Secondary | ICD-10-CM | POA: Insufficient documentation

## 2019-04-04 DIAGNOSIS — Z79899 Other long term (current) drug therapy: Secondary | ICD-10-CM | POA: Insufficient documentation

## 2019-04-04 DIAGNOSIS — K2950 Unspecified chronic gastritis without bleeding: Secondary | ICD-10-CM | POA: Insufficient documentation

## 2019-04-04 DIAGNOSIS — Z888 Allergy status to other drugs, medicaments and biological substances status: Secondary | ICD-10-CM | POA: Diagnosis not present

## 2019-04-04 DIAGNOSIS — Z7984 Long term (current) use of oral hypoglycemic drugs: Secondary | ICD-10-CM | POA: Insufficient documentation

## 2019-04-04 DIAGNOSIS — E119 Type 2 diabetes mellitus without complications: Secondary | ICD-10-CM | POA: Insufficient documentation

## 2019-04-04 DIAGNOSIS — K2100 Gastro-esophageal reflux disease with esophagitis, without bleeding: Secondary | ICD-10-CM | POA: Insufficient documentation

## 2019-04-04 DIAGNOSIS — Z1211 Encounter for screening for malignant neoplasm of colon: Secondary | ICD-10-CM | POA: Insufficient documentation

## 2019-04-04 DIAGNOSIS — K640 First degree hemorrhoids: Secondary | ICD-10-CM | POA: Insufficient documentation

## 2019-04-04 DIAGNOSIS — R1013 Epigastric pain: Secondary | ICD-10-CM | POA: Diagnosis present

## 2019-04-04 HISTORY — PX: COLONOSCOPY WITH PROPOFOL: SHX5780

## 2019-04-04 HISTORY — PX: ESOPHAGOGASTRODUODENOSCOPY (EGD) WITH PROPOFOL: SHX5813

## 2019-04-04 LAB — GLUCOSE, CAPILLARY: Glucose-Capillary: 93 mg/dL (ref 70–99)

## 2019-04-04 SURGERY — ESOPHAGOGASTRODUODENOSCOPY (EGD) WITH PROPOFOL
Anesthesia: General

## 2019-04-04 MED ORDER — PROPOFOL 500 MG/50ML IV EMUL
INTRAVENOUS | Status: AC
  Filled 2019-04-04: qty 50

## 2019-04-04 MED ORDER — LIDOCAINE HCL (CARDIAC) PF 100 MG/5ML IV SOSY
PREFILLED_SYRINGE | INTRAVENOUS | Status: DC | PRN
  Administered 2019-04-04: 40 mg via INTRAVENOUS

## 2019-04-04 MED ORDER — PROPOFOL 500 MG/50ML IV EMUL
INTRAVENOUS | Status: DC | PRN
  Administered 2019-04-04: 125 ug/kg/min via INTRAVENOUS
  Administered 2019-04-04: 100 mg via INTRAVENOUS

## 2019-04-04 MED ORDER — LACTATED RINGERS IV SOLN: INTRAVENOUS | Status: DC | PRN

## 2019-04-04 MED ORDER — SODIUM CHLORIDE 0.9 % IV SOLN: INTRAVENOUS | Status: DC

## 2019-04-04 NOTE — Anesthesia Preprocedure Evaluation (Signed)
Anesthesia Evaluation  Patient identified by MRN, date of birth, ID band Patient awake    Reviewed: Allergy & Precautions, NPO status , Patient's Chart, lab work & pertinent test results  History of Anesthesia Complications Negative for: history of anesthetic complications  Airway Mallampati: II  TM Distance: >3 FB Neck ROM: Full    Dental no notable dental hx.    Pulmonary neg pulmonary ROS, neg sleep apnea, neg COPD,    breath sounds clear to auscultation- rhonchi (-) wheezing      Cardiovascular Exercise Tolerance: Good (-) hypertension(-) CAD, (-) Past MI, (-) Cardiac Stents and (-) CABG  Rhythm:Regular Rate:Normal - Systolic murmurs and - Diastolic murmurs    Neuro/Psych neg Seizures negative neurological ROS  negative psych ROS   GI/Hepatic negative GI ROS, Neg liver ROS,   Endo/Other  diabetes, Oral Hypoglycemic AgentsHypothyroidism   Renal/GU negative Renal ROS     Musculoskeletal negative musculoskeletal ROS (+)   Abdominal (+) - obese,   Peds  Hematology negative hematology ROS (+)   Anesthesia Other Findings Past Medical History: No date: Diabetes mellitus without complication (HCC) No date: Hyperlipidemia   Reproductive/Obstetrics                             Anesthesia Physical Anesthesia Plan  ASA: II  Anesthesia Plan: General   Post-op Pain Management:    Induction: Intravenous  PONV Risk Score and Plan: 1 and Propofol infusion  Airway Management Planned: Natural Airway  Additional Equipment:   Intra-op Plan:   Post-operative Plan:   Informed Consent: I have reviewed the patients History and Physical, chart, labs and discussed the procedure including the risks, benefits and alternatives for the proposed anesthesia with the patient or authorized representative who has indicated his/her understanding and acceptance.     Dental advisory given  Plan  Discussed with: CRNA and Anesthesiologist  Anesthesia Plan Comments:         Anesthesia Quick Evaluation

## 2019-04-04 NOTE — Interval H&P Note (Signed)
History and Physical Interval Note:  04/04/2019 9:41 AM  Tyler Cox  has presented today for surgery, with the diagnosis of GERD,BLOATING EPIGASTRIC PAIN COLON CANCER SCREENING.  The various methods of treatment have been discussed with the patient and family. After consideration of risks, benefits and other options for treatment, the patient has consented to  Procedure(s): ESOPHAGOGASTRODUODENOSCOPY (EGD) WITH PROPOFOL (N/A) COLONOSCOPY WITH PROPOFOL (N/A) as a surgical intervention.  The patient's history has been reviewed, patient examined, no change in status, stable for surgery.  I have reviewed the patient's chart and labs.  Questions were answered to the patient's satisfaction.     [place], [place]

## 2019-04-04 NOTE — H&P (Signed)
Outpatient short stay form Pre-procedure 04/04/2019 9:39 AM Tyler Cox K. [name], M.D.  Primary Physician: Bethann Punches, M.D.  Reason for visit:  Epigastric pain, GERD, colon cancer screening  History of present illness:  Patient with chronic epigastric pain not tolerant of PPI, taking carafate and pepcid currently. No hemetemesis but has some pyrosis. Some globus sensation without dysphagia. H pylori breath test negative 11/2018. Patient presents for colonoscopy for colon cancer screening. The patient denies complaints of abdominal pain, significant change in bowel habits, or rectal bleeding.      Current Facility-Administered Medications:  .  0.9 %  sodium chloride infusion, , Intravenous, Continuous, [place], Boykin Nearing, MD, Last Rate: 20 mL/hr at 04/04/19 0918, New Bag at 04/04/19 0918  Medications Prior to Admission  Medication Sig Dispense Refill Last Dose  . metFORMIN (GLUCOPHAGE) 500 MG tablet Take 500 mg by mouth daily.   Past Week at Unknown time  . sucralfate (CARAFATE) 1 g tablet Take by mouth.   Past Week at Unknown time  . famotidine (PEPCID) 20 MG tablet Take 20 mg by mouth 2 (two) times daily.   Not Taking at Unknown time  . ibuprofen (ADVIL) 800 MG tablet Take 1 tablet (800 mg total) by mouth every 8 (eight) hours as needed for mild pain or moderate pain. 30 tablet 0   . lisinopril (ZESTRIL) 10 MG tablet Take 10 mg by mouth daily.   Not Taking at Unknown time     Allergies  Allergen Reactions  . Omeprazole Diarrhea     Past Medical History:  Diagnosis Date  . Diabetes mellitus without complication (HCC)   . Hyperlipidemia     Review of systems:  Otherwise negative.    Physical Exam  Gen: Alert, oriented. Appears stated age.  HEENT: [place]/AT. PERRLA. Lungs: CTA, no wheezes. CV: RR nl S1, S2. Abd: soft, benign, no masses. BS+ Ext: No edema. Pulses 2+    Planned procedures: Proceed with EGD and colonoscopy. The patient understands the nature of the planned  procedure, indications, risks, alternatives and potential complications including but not limited to bleeding, infection, perforation, damage to internal organs and possible oversedation/side effects from anesthesia. The patient agrees and gives consent to proceed.  Please refer to procedure notes for findings, recommendations and patient disposition/instructions.     Venetta Knee K. [name], M.D. Gastroenterology 04/04/2019  9:39 AM

## 2019-04-04 NOTE — Transfer of Care (Signed)
Immediate Anesthesia Transfer of Care Note  Patient: Tyler Cox  Procedure(s) Performed: ESOPHAGOGASTRODUODENOSCOPY (EGD) WITH PROPOFOL (N/A ) COLONOSCOPY WITH PROPOFOL (N/A )  Patient Location: PACU  Anesthesia Type:MAC  Level of Consciousness: drowsy  Airway & Oxygen Therapy: Patient Spontanous Breathing  Post-op Assessment: Report given to RN  Post vital signs: stable  Last Vitals:  Vitals Value Taken Time  BP    Temp    Pulse    Resp    SpO2      Last Pain:  Vitals:   04/04/19 0855  TempSrc: Temporal  PainSc: 0-No pain         Complications: No apparent anesthesia complications

## 2019-04-04 NOTE — Op Note (Signed)
[facility] Gastroenterology Patient Name: Tyler Cox Procedure Date: 04/04/2019 9:46 AM MRN: [id number] Account #: [id number] Date of Birth: 01-Dec-1955 Admit Type: Outpatient Age: 64 Room: [facility] ENDO ROOM 3 Gender: Male Note Status: Finalized Procedure:             Colonoscopy Indications:           Screening for colorectal malignant neoplasm Providers:             Boykin Nearing. [name] MD, MD Referring MD:          Danella Penton, MD (Referring MD) Medicines:             Propofol per Anesthesia Complications:         No immediate complications. Procedure:             Pre-Anesthesia Assessment:                        - The risks and benefits of the procedure and the                         sedation options and risks were discussed with the                         patient. All questions were answered and informed                         consent was obtained.                        - Patient identification and proposed procedure were                         verified prior to the procedure by the nurse. The                         procedure was verified in the procedure room.                        - ASA Grade Assessment: III - A patient with severe                         systemic disease.                        - After reviewing the risks and benefits, the patient                         was deemed in satisfactory condition to undergo the                         procedure.                        After obtaining informed consent, the colonoscope was                         passed under direct vision. Throughout the procedure,                         the patient's blood pressure,  pulse, and oxygen                         saturations were monitored continuously. The                         Colonoscope was introduced through the anus and                         advanced to the the cecum, identified by appendiceal                         orifice and ileocecal valve. The  colonoscopy was                         performed without difficulty. The patient tolerated                         the procedure well. The quality of the bowel                         preparation was excellent. The ileocecal valve,                         appendiceal orifice, and rectum were photographed. Findings:      The perianal and digital rectal examinations were normal. Pertinent       negatives include normal sphincter tone and no palpable rectal lesions.      Non-bleeding internal hemorrhoids were found during retroflexion. The       hemorrhoids were Grade I (internal hemorrhoids that do not prolapse).      The exam was otherwise without abnormality on direct and retroflexion       views. Impression:            - Non-bleeding internal hemorrhoids.                        - The examination was otherwise normal on direct and                         retroflexion views.                        - No specimens collected. Recommendation:        - Await pathology results from EGD, also performed                         today.                        - Patient has a contact number available for                         emergencies. The signs and symptoms of potential                         delayed complications were discussed with the patient.                         Return to normal activities tomorrow. Written  discharge instructions were provided to the patient.                        - Resume previous diet.                        - Continue present medications.                        - Repeat colonoscopy in 10 years for screening                         purposes.                        - Return to physician assistant in 3 months.                        - The findings and recommendations were discussed with                         the patient. Procedure Code(s):     --- Professional ---                        R1540, Colorectal cancer screening; colonoscopy on                          individual not meeting criteria for high risk Diagnosis Code(s):     --- Professional ---                        K64.0, First degree hemorrhoids                        Z12.11, Encounter for screening for malignant neoplasm                         of colon CPT copyright 2019 American [employer] Association. All rights reserved. The codes documented in this report are preliminary and upon coder review may  be revised to meet current compliance requirements. Efrain Sella MD, MD 04/04/2019 10:12:05 AM This report has been signed electronically. Number of Addenda: 0 Note Initiated On: 04/04/2019 9:46 AM Scope Withdrawal Time: 0 hours 6 minutes 49 seconds  Total Procedure Duration: 0 hours 10 minutes 6 seconds  Estimated Blood Loss:  Estimated blood loss: none.      [facility]

## 2019-04-04 NOTE — Op Note (Signed)
[facility] Gastroenterology Patient Name: Tyler Cox Procedure Date: 04/04/2019 9:47 AM MRN: [id number] Account #: [id number] Date of Birth: May 17, 1955 Admit Type: Outpatient Age: 64 Room: [facility] ENDO ROOM 3 Gender: Male Note Status: Finalized Procedure:             Upper GI endoscopy Indications:           Epigastric abdominal pain Providers:             Boykin Nearing. [name] MD, MD Referring MD:          Danella Penton, MD (Referring MD) Medicines:             Propofol per Anesthesia Complications:         No immediate complications. Procedure:             Pre-Anesthesia Assessment:                        - The risks and benefits of the procedure and the                         sedation options and risks were discussed with the                         patient. All questions were answered and informed                         consent was obtained.                        - Patient identification and proposed procedure were                         verified prior to the procedure by the nurse. The                         procedure was verified in the procedure room.                        - ASA Grade Assessment: III - A patient with severe                         systemic disease.                        - After reviewing the risks and benefits, the patient                         was deemed in satisfactory condition to undergo the                         procedure.                        After obtaining informed consent, the endoscope was                         passed under direct vision. Throughout the procedure,                         the patient's blood pressure,  pulse, and oxygen                         saturations were monitored continuously. The Endoscope                         was introduced through the mouth, and advanced to the                         third part of duodenum. The upper GI endoscopy was                         accomplished without difficulty.  The patient tolerated                         the procedure well. Findings:      The Z-line was irregular and was found 37 to 38 cm from the incisors.       Mucosa was biopsied with a cold forceps for histology. One specimen       bottle was sent to pathology.      Patchy mild inflammation characterized by erosions and erythema was       found in the gastric antrum. Biopsies were taken with a cold forceps for       Helicobacter pylori testing.      The cardia and gastric fundus were normal on retroflexion.      The examined duodenum was normal. Impression:            - Z-line irregular, 37 to 38 cm from the incisors.                         Biopsied.                        - Gastritis. Biopsied.                        - Normal examined duodenum. Recommendation:        - Await pathology results.                        - Proceed with colonoscopy Procedure Code(s):     --- Professional ---                        [id number], Esophagogastroduodenoscopy, flexible,                         transoral; with biopsy, single or multiple Diagnosis Code(s):     --- Professional ---                        R10.13, Epigastric pain                        K29.70, Gastritis, unspecified, without bleeding                        K22.8, Other specified diseases of esophagus CPT copyright 2019 American Medical Association. All rights reserved. The codes documented in this report are preliminary and upon coder review may  be revised to meet current compliance requirements. [place]  MD, MD 04/04/2019 9:57:12 AM This report has been signed electronically. Number of Addenda: 0 Note Initiated On: 04/04/2019 9:47 AM Estimated Blood Loss:  Estimated blood loss: none.      [facility]

## 2019-04-04 NOTE — Anesthesia Postprocedure Evaluation (Signed)
Anesthesia Post Note  Patient: Xaivier Malay  Procedure(s) Performed: ESOPHAGOGASTRODUODENOSCOPY (EGD) WITH PROPOFOL (N/A ) COLONOSCOPY WITH PROPOFOL (N/A )  Patient location during evaluation: Endoscopy Anesthesia Type: General Level of consciousness: awake and alert and oriented Pain management: pain level controlled Vital Signs Assessment: post-procedure vital signs reviewed and stable Respiratory status: spontaneous breathing, nonlabored ventilation and respiratory function stable Cardiovascular status: blood pressure returned to baseline and stable Postop Assessment: no signs of nausea or vomiting Anesthetic complications: no     Last Vitals:  Vitals:   04/04/19 1013 04/04/19 1023  BP: 134/78 135/78  Pulse: 62 (!) 56  Resp: 14 13  Temp: (!) 36.1 C   SpO2: 98% 99%    Last Pain:  Vitals:   04/04/19 1023  TempSrc:   PainSc: 0-No pain                 Kutler Vanvranken

## 2019-04-05 ENCOUNTER — Encounter: Payer: Self-pay | Admitting: *Deleted

## 2019-04-06 LAB — SURGICAL PATHOLOGY

## 2020-07-14 IMAGING — US US ABDOMEN COMPLETE
1 series · 14 of 25 positions shown · non-contrast
Comparison: None.

CLINICAL DATA: Generalized abdominal pain.

EXAM:
ABDOMEN ULTRASOUND COMPLETE

[Series 1: us abdomen complete · 0.19mm/px · 14 of 93 slices shown]
[im 1/93]
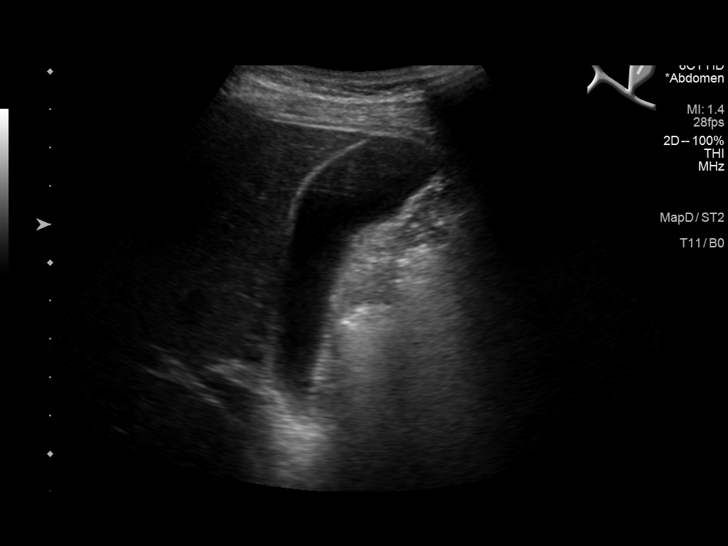
[im 8/93]
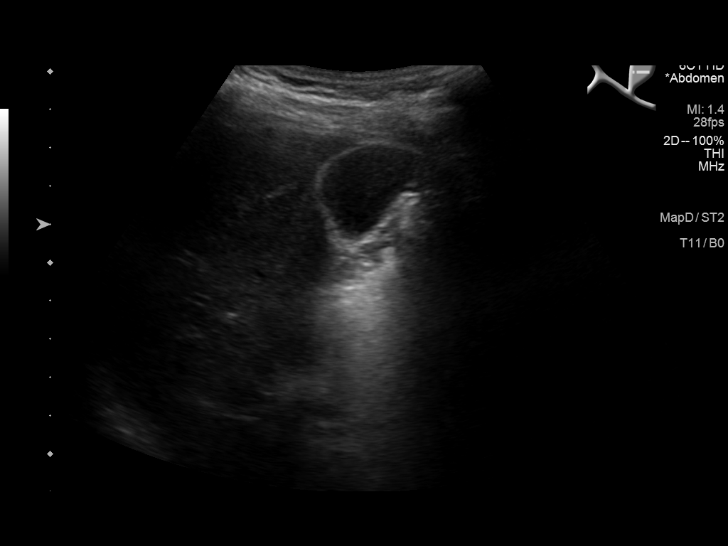
[im 16/93]
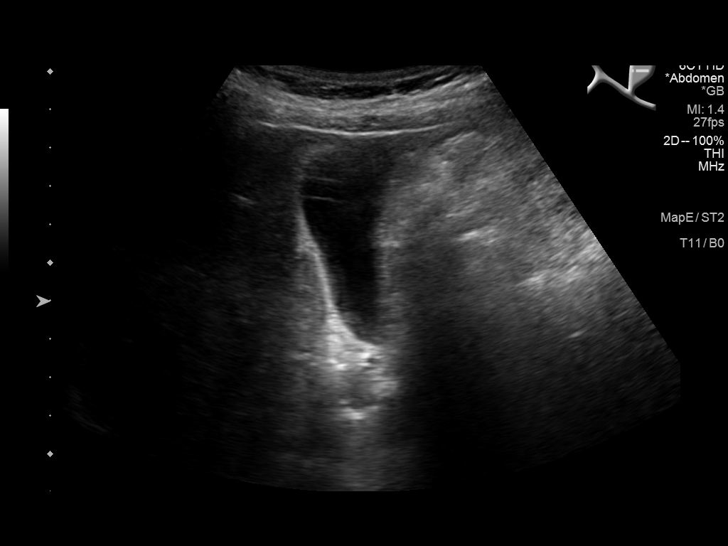
[im 24/93]
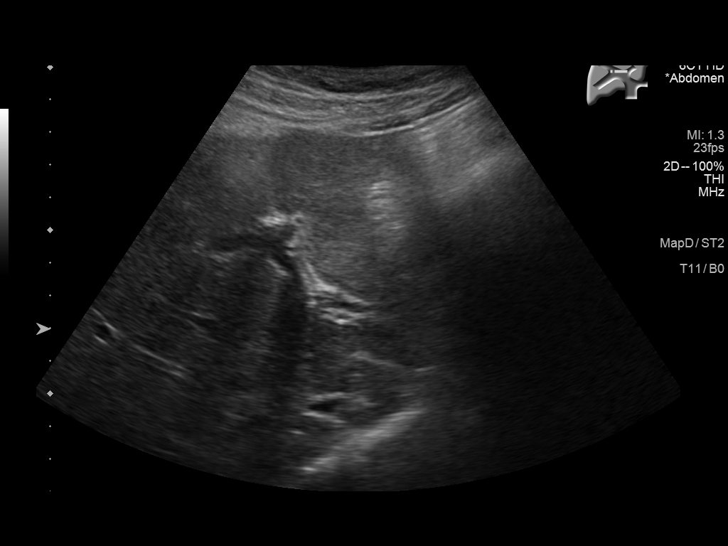
[im 31/93]
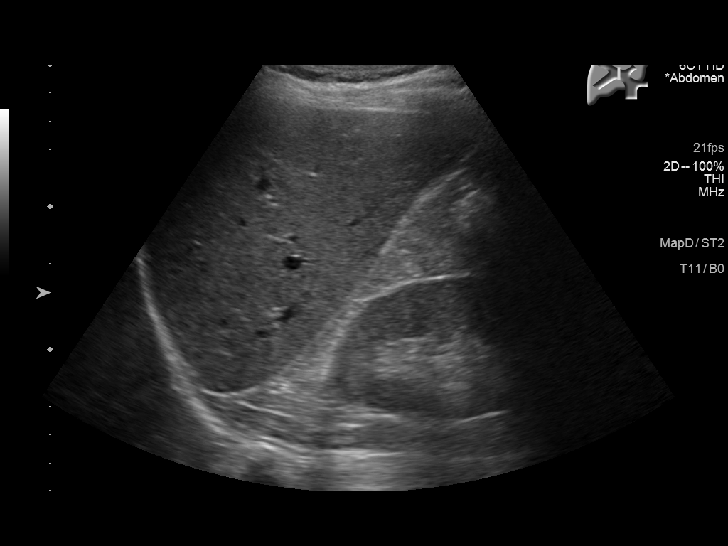
[im 35/93]
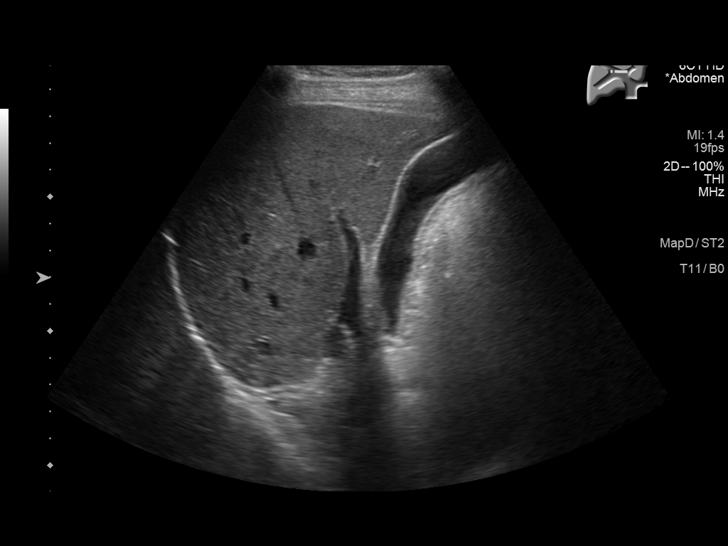
[im 43/93]
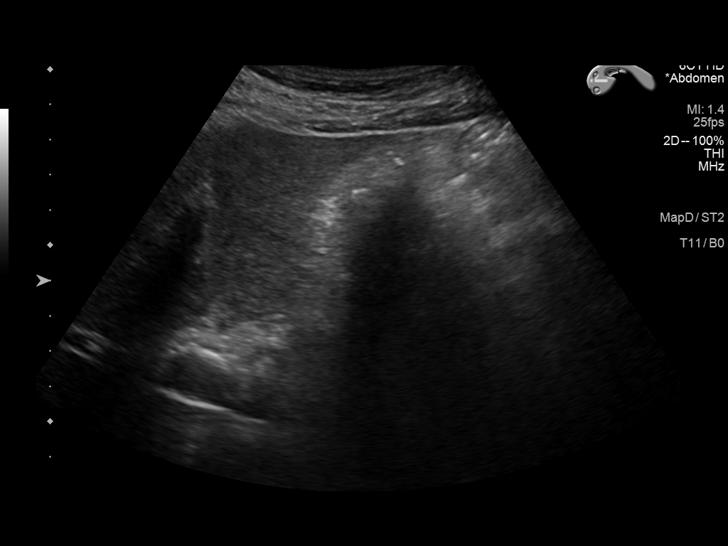
[im 50/93]
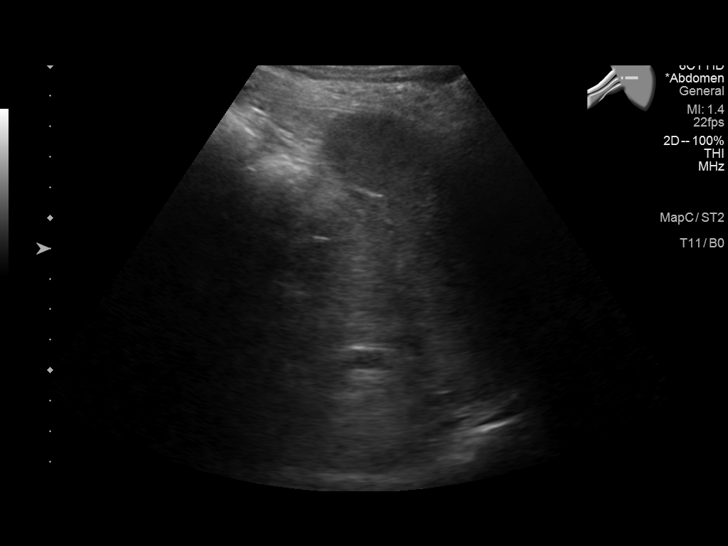
[im 58/93]
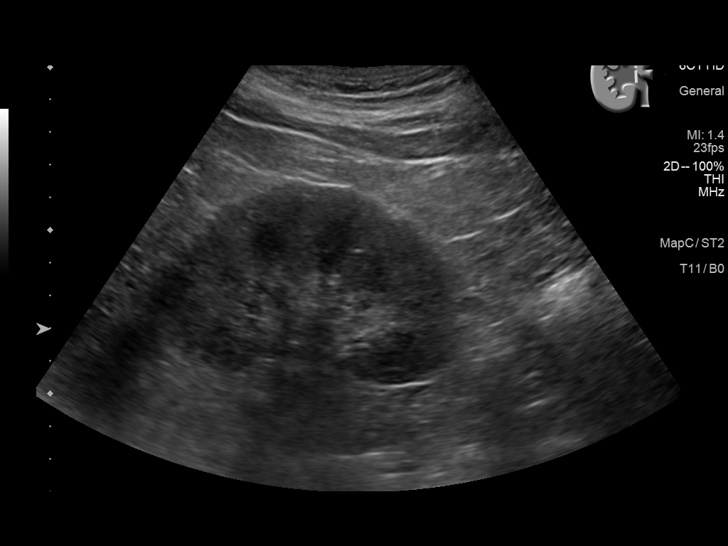
[im 62/93]
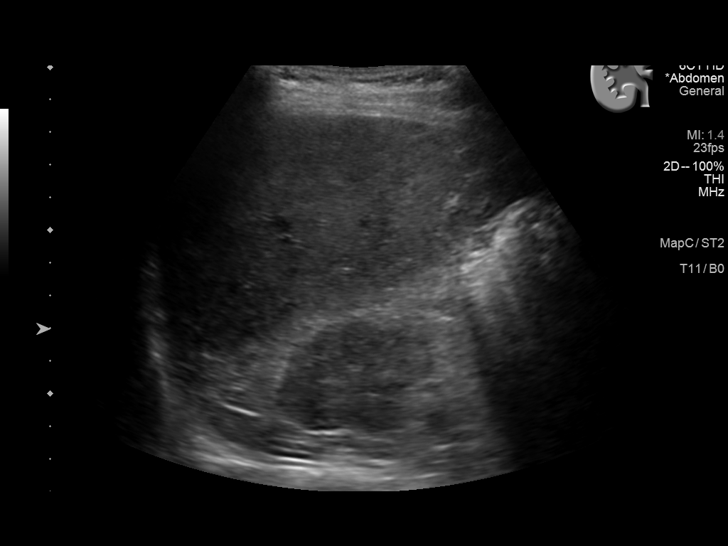
[im 70/93]
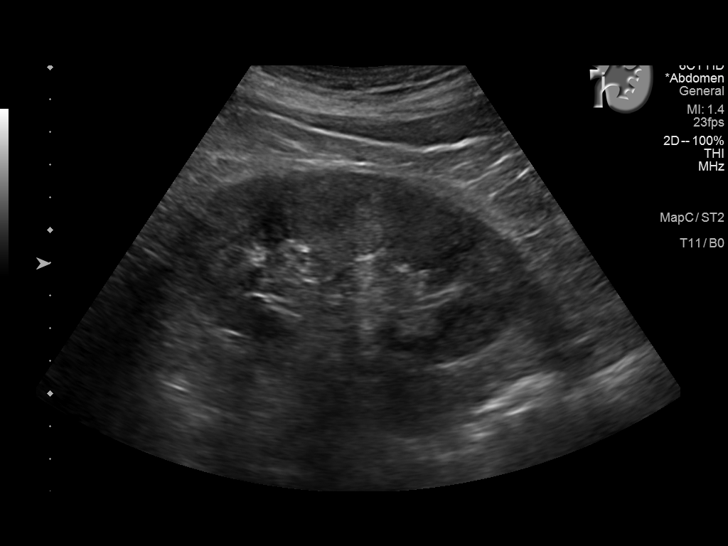
[im 77/93]
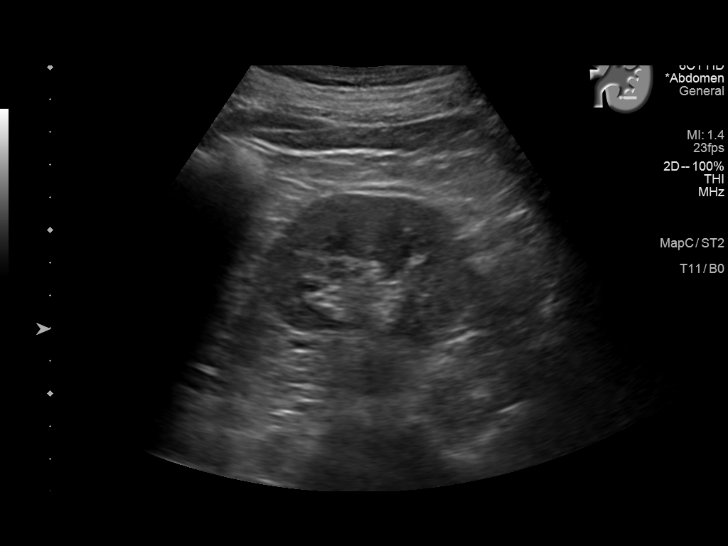
[im 85/93]
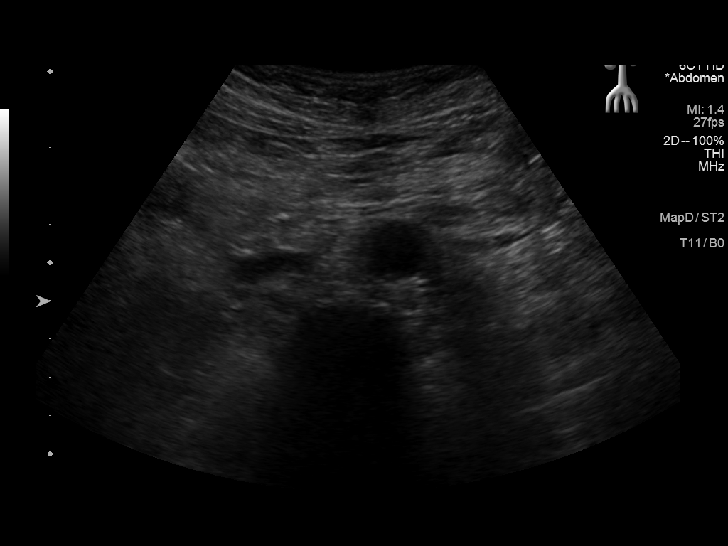
[im 93/93]
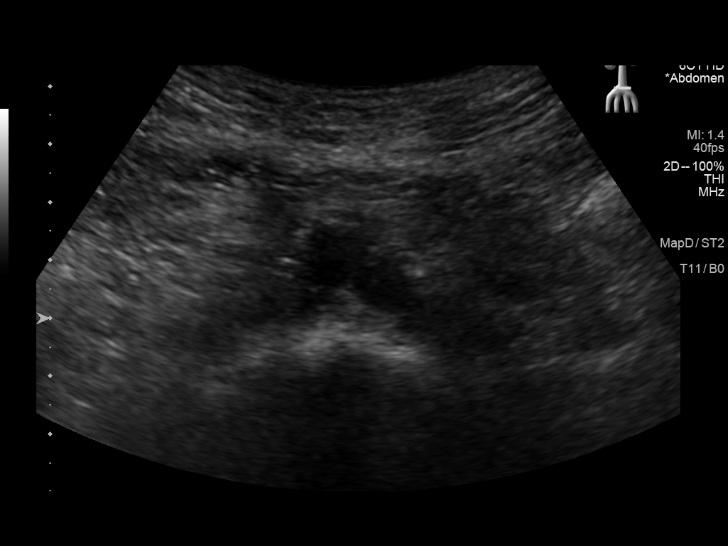

[14 of 25 positions shown; findings below may reference images not displayed]

FINDINGS: Gallbladder: No gallstones or wall thickening visualized. No
sonographic Murphy sign noted by sonographer.

Common bile duct: Diameter: 3 mm which is within normal limits.

Liver: No focal lesion identified. Within normal limits in
parenchymal echogenicity. Portal vein is patent on color Doppler
imaging with normal direction of blood flow towards the liver.

IVC: No abnormality visualized.

Pancreas: Not visualized due to overlying bowel gas.

Spleen: Size and appearance within normal limits.

Right Kidney: Length: 10.5 cm. Echogenicity within normal limits. No
mass or hydronephrosis visualized.

Left Kidney: Length: 11.2 cm. Echogenicity within normal limits. No
mass or hydronephrosis visualized.

Abdominal aorta: No aneurysm visualized.

Other findings: None.
IMPRESSION: Pancreas is not visualized due to overlying bowel gas. No definite
abnormality seen in the abdomen.
